# Patient Record
Sex: Male | Born: 1975 | Race: Black or African American | Hispanic: No | Marital: Single | Smoking: Current every day smoker
Health system: Southern US, Community
[De-identification: ages and names within clinical notes are randomized; demographics above are authoritative.]

## PROBLEM LIST (undated history)

## (undated) DIAGNOSIS — E119 Type 2 diabetes mellitus without complications: Secondary | ICD-10-CM

## (undated) HISTORY — PX: INCISION AND DRAINAGE ABSCESS: SHX5864

---

## 2004-05-09 ENCOUNTER — Other Ambulatory Visit: Payer: Self-pay

## 2004-05-09 ENCOUNTER — Observation Stay: Payer: Self-pay | Admitting: General Surgery

## 2005-07-13 ENCOUNTER — Emergency Department: Payer: Self-pay | Admitting: Unknown Physician Specialty

## 2005-07-13 ENCOUNTER — Other Ambulatory Visit: Payer: Self-pay

## 2005-12-01 ENCOUNTER — Emergency Department: Payer: Self-pay | Admitting: Emergency Medicine

## 2007-01-08 ENCOUNTER — Emergency Department: Payer: Self-pay | Admitting: Emergency Medicine

## 2007-04-12 ENCOUNTER — Other Ambulatory Visit: Payer: Self-pay

## 2007-04-12 ENCOUNTER — Emergency Department: Payer: Self-pay | Admitting: Emergency Medicine

## 2007-05-06 ENCOUNTER — Emergency Department: Payer: Self-pay | Admitting: Emergency Medicine

## 2007-09-21 ENCOUNTER — Emergency Department (HOSPITAL_COMMUNITY): Admission: EM | Admit: 2007-09-21 | Discharge: 2007-09-21 | Payer: Self-pay | Admitting: Emergency Medicine
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
## 2011-03-22 LAB — CBC
HCT: 44.4
Hemoglobin: 15.7
RBC: 4.95
RDW: 12.8
WBC: 6.1

## 2011-03-22 LAB — DIFFERENTIAL
Basophils Absolute: 0
Lymphocytes Relative: 40
Lymphs Abs: 2.4
Monocytes Absolute: 0.4
Monocytes Relative: 6
Neutro Abs: 3.2

## 2011-03-22 LAB — BASIC METABOLIC PANEL
Calcium: 9.2
GFR calc Af Amer: >60
GFR calc non Af Amer: >60
Potassium: 3.4 — ABNORMAL LOW
Sodium: 134 — ABNORMAL LOW

## 2015-12-01 ENCOUNTER — Encounter: Payer: Self-pay | Admitting: Emergency Medicine

## 2015-12-01 ENCOUNTER — Emergency Department
Admission: EM | Admit: 2015-12-01 | Discharge: 2015-12-01 | Disposition: A | Discharge status: Home / Self Care | Payer: BLUE CROSS/BLUE SHIELD | Attending: Emergency Medicine | Admitting: Emergency Medicine

## 2015-12-01 DIAGNOSIS — Z7984 Long term (current) use of oral hypoglycemic drugs: Secondary | ICD-10-CM | POA: Diagnosis not present

## 2015-12-01 DIAGNOSIS — F17210 Nicotine dependence, cigarettes, uncomplicated: Secondary | ICD-10-CM | POA: Diagnosis not present

## 2015-12-01 DIAGNOSIS — E1165 Type 2 diabetes mellitus with hyperglycemia: Secondary | ICD-10-CM | POA: Insufficient documentation

## 2015-12-01 DIAGNOSIS — R631 Polydipsia: Secondary | ICD-10-CM | POA: Diagnosis present

## 2015-12-01 DIAGNOSIS — R739 Hyperglycemia, unspecified: Secondary | ICD-10-CM

## 2015-12-01 HISTORY — DX: Type 2 diabetes mellitus without complications: E11.9

## 2015-12-01 LAB — URINALYSIS COMPLETE WITH MICROSCOPIC (ARMC ONLY)
BILIRUBIN URINE: NEGATIVE
Bacteria, UA: NONE SEEN
HGB URINE DIPSTICK: NEGATIVE
KETONES UR: NEGATIVE mg/dL
Leukocytes, UA: NEGATIVE
NITRITE: NEGATIVE
Protein, ur: NEGATIVE mg/dL
SPECIFIC GRAVITY, URINE: 1.028 (ref 1.005–1.030)
pH: 5 (ref 5.0–8.0)

## 2015-12-01 LAB — GLUCOSE, CAPILLARY
GLUCOSE-CAPILLARY: 453 mg/dL — AB (ref 65–99)
Glucose-Capillary: 274 mg/dL — ABNORMAL HIGH (ref 65–99)

## 2015-12-01 LAB — BASIC METABOLIC PANEL
Anion gap: 8 (ref 5–15)
BUN: 15 mg/dL (ref 6–20)
CALCIUM: 8.9 mg/dL (ref 8.9–10.3)
CO2: 26 mmol/L (ref 22–32)
Chloride: 99 mmol/L — ABNORMAL LOW (ref 101–111)
Creatinine, Ser: 0.98 mg/dL (ref 0.61–1.24)
GFR calc Af Amer: >60 mL/min (ref >60)
GLUCOSE: 496 mg/dL — AB (ref 65–99)
Potassium: 4.6 mmol/L (ref 3.5–5.1)
SODIUM: 133 mmol/L — AB (ref 135–145)

## 2015-12-01 LAB — CBC
HCT: 46.5 % (ref 40.0–52.0)
Hemoglobin: 15.8 g/dL (ref 13.0–18.0)
MCH: 31.2 pg (ref 26.0–34.0)
MCHC: 33.9 g/dL (ref 32.0–36.0)
MCV: 92.1 fL (ref 80.0–100.0)
PLATELETS: 250 10*3/uL (ref 150–440)
RBC: 5.05 MIL/uL (ref 4.40–5.90)
RDW: 12.6 % (ref 11.5–14.5)
WBC: 7.3 10*3/uL (ref 3.8–10.6)

## 2015-12-01 LAB — CK: CK TOTAL: 132 U/L (ref 49–397)

## 2015-12-01 LAB — BETA-HYDROXYBUTYRIC ACID: Beta-Hydroxybutyric Acid: 0.1 mmol/L (ref 0.05–0.27)

## 2015-12-01 MED ORDER — SODIUM CHLORIDE 0.9 % IV BOLUS (SEPSIS)
1000.0000 mL | Freq: Once | INTRAVENOUS | Status: AC
  Administered 2015-12-01: 1000 mL via INTRAVENOUS

## 2015-12-01 MED ORDER — INSULIN ASPART 100 UNIT/ML ~~LOC~~ SOLN
10.0000 [IU] | Freq: Once | SUBCUTANEOUS | Status: AC
  Administered 2015-12-01: 10 [IU] via SUBCUTANEOUS
  Filled 2015-12-01: qty 10
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
## 2015-12-01 NOTE — Discharge Instructions (Signed)
1. Drink plenty of fluids daily. 2. Speak with your doctor about possible medication adjustments to better control your blood sugar. 3. Return to the ER for worsening symptoms, persistent vomiting, difficulty breathing or other concerns.  Hyperglycemia Hyperglycemia occurs when the glucose (sugar) in your blood is too high. Hyperglycemia can happen for many reasons, but it most often happens to people who do not know they have diabetes or are not managing their diabetes properly.  CAUSES  Whether you have diabetes or not, there are other causes of hyperglycemia. Hyperglycemia can occur when you have diabetes, but it can also occur in other situations that you might not be as aware of, such as: Diabetes  If you have diabetes and are having problems controlling your blood glucose, hyperglycemia could occur because of some of the following reasons:  Not following your meal plan.  Not taking your diabetes medications or not taking it properly.  Exercising less or doing less activity than you normally do.  Being sick. Pre-diabetes  This cannot be ignored. Before people develop Type 2 diabetes, they almost always have "pre-diabetes." This is when your blood glucose levels are higher than normal, but not yet high enough to be diagnosed as diabetes. Research has shown that some long-term damage to the body, especially the heart and circulatory system, may already be occurring during pre-diabetes. If you take action to manage your blood glucose when you have pre-diabetes, you may delay or prevent Type 2 diabetes from developing. Stress  If you have diabetes, you may be "diet" controlled or on oral medications or insulin to control your diabetes. However, you may find that your blood glucose is higher than usual in the hospital whether you have diabetes or not. This is often referred to as "stress hyperglycemia." Stress can elevate your blood glucose. This happens because of hormones put out by the  body during times of stress. If stress has been the cause of your high blood glucose, it can be followed regularly by your caregiver. That way he/she can make sure your hyperglycemia does not continue to get worse or progress to diabetes. Steroids  Steroids are medications that act on the infection fighting system (immune system) to block inflammation or infection. One side effect can be a rise in blood glucose. Most people can produce enough extra insulin to allow for this rise, but for those who cannot, steroids make blood glucose levels go even higher. It is not unusual for steroid treatments to "uncover" diabetes that is developing. It is not always possible to determine if the hyperglycemia will go away after the steroids are stopped. A special blood test called an A1c is sometimes done to determine if your blood glucose was elevated before the steroids were started. SYMPTOMS  Thirsty.  Frequent urination.  Dry mouth.  Blurred vision.  Tired or fatigue.  Weakness.  Sleepy.  Tingling in feet or leg. DIAGNOSIS  Diagnosis is made by monitoring blood glucose in one or all of the following ways:  A1c test. This is a chemical found in your blood.  Fingerstick blood glucose monitoring.  Laboratory results. TREATMENT  First, knowing the cause of the hyperglycemia is important before the hyperglycemia can be treated. Treatment may include, but is not be limited to:  Education.  Change or adjustment in medications.  Change or adjustment in meal plan.  Treatment for an illness, infection, etc.  More frequent blood glucose monitoring.  Change in exercise plan.  Decreasing or stopping steroids.  Lifestyle changes.  HOME CARE INSTRUCTIONS   Test your blood glucose as directed.  Exercise regularly. Your caregiver will give you instructions about exercise. Pre-diabetes or diabetes which comes on with stress is helped by exercising.  Eat wholesome, balanced meals. Eat often  and at regular, fixed times. Your caregiver or nutritionist will give you a meal plan to guide your sugar intake.  Being at an ideal weight is important. If needed, losing as little as 10 to 15 pounds may help improve blood glucose levels. SEEK MEDICAL CARE IF:   You have questions about medicine, activity, or diet.  You continue to have symptoms (problems such as increased thirst, urination, or weight gain). SEEK IMMEDIATE MEDICAL CARE IF:   You are vomiting or have diarrhea.  Your breath smells fruity.  You are breathing faster or slower.  You are very sleepy or incoherent.  You have numbness, tingling, or pain in your feet or hands.  You have chest pain.  Your symptoms get worse even though you have been following your caregiver's orders.  If you have any other questions or concerns.   This information is not intended to replace advice given to you by your health care provider. Make sure you discuss any questions you have with your health care provider.   Document Released: 12/08/2000 Document Revised: 09/06/2011 Document Reviewed: 02/18/2015 Elsevier Interactive Patient Education [employer]

## 2015-12-01 NOTE — ED Notes (Addendum)
Pt says he started feeling bad around 7pm Saturday night; pt is diabetic and blood sugars have been in the 300's; pt has been admitted before for DKA and says he feels similar to that time; feels "out of it" sometimes; pt ambulatory with steady gait; talking in complete coherent sentences; pt says he's been having cramps in his legs and back
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
## 2015-12-01 NOTE — ED Notes (Signed)
Pt. bs at 274
